# Patient Record
Sex: Female | Born: 1975 | Race: White | Hispanic: No | Marital: Single | Smoking: Current every day smoker
Health system: Southern US, Community
[De-identification: ages and names within clinical notes are randomized; demographics above are authoritative.]

## PROBLEM LIST (undated history)

## (undated) DIAGNOSIS — F4310 Post-traumatic stress disorder, unspecified: Secondary | ICD-10-CM

## (undated) DIAGNOSIS — F419 Anxiety disorder, unspecified: Secondary | ICD-10-CM

## (undated) DIAGNOSIS — R569 Unspecified convulsions: Secondary | ICD-10-CM

## (undated) DIAGNOSIS — Z22322 Carrier or suspected carrier of Methicillin resistant Staphylococcus aureus: Secondary | ICD-10-CM

## (undated) HISTORY — PX: OTHER SURGICAL HISTORY: SHX169

## (undated) HISTORY — PX: APPENDECTOMY: SHX54

---

## 2014-08-19 ENCOUNTER — Emergency Department (HOSPITAL_COMMUNITY): Payer: Self-pay

## 2014-08-19 ENCOUNTER — Encounter (HOSPITAL_COMMUNITY): Payer: Self-pay | Admitting: Emergency Medicine

## 2014-08-19 ENCOUNTER — Emergency Department (HOSPITAL_COMMUNITY)
Admission: EM | Admit: 2014-08-19 | Discharge: 2014-08-20 | Disposition: A | Discharge status: Home / Self Care | Payer: Self-pay | Attending: Emergency Medicine | Admitting: Emergency Medicine

## 2014-08-19 DIAGNOSIS — S299XXA Unspecified injury of thorax, initial encounter: Secondary | ICD-10-CM | POA: Insufficient documentation

## 2014-08-19 DIAGNOSIS — Y92410 Unspecified street and highway as the place of occurrence of the external cause: Secondary | ICD-10-CM | POA: Insufficient documentation

## 2014-08-19 DIAGNOSIS — Y999 Unspecified external cause status: Secondary | ICD-10-CM | POA: Insufficient documentation

## 2014-08-19 DIAGNOSIS — Z8659 Personal history of other mental and behavioral disorders: Secondary | ICD-10-CM | POA: Insufficient documentation

## 2014-08-19 DIAGNOSIS — Z720 Tobacco use: Secondary | ICD-10-CM | POA: Insufficient documentation

## 2014-08-19 DIAGNOSIS — S80812A Abrasion, left lower leg, initial encounter: Secondary | ICD-10-CM | POA: Insufficient documentation

## 2014-08-19 DIAGNOSIS — S50811A Abrasion of right forearm, initial encounter: Secondary | ICD-10-CM | POA: Insufficient documentation

## 2014-08-19 DIAGNOSIS — F1910 Other psychoactive substance abuse, uncomplicated: Secondary | ICD-10-CM

## 2014-08-19 DIAGNOSIS — S3992XA Unspecified injury of lower back, initial encounter: Secondary | ICD-10-CM | POA: Insufficient documentation

## 2014-08-19 DIAGNOSIS — S301XXA Contusion of abdominal wall, initial encounter: Secondary | ICD-10-CM | POA: Insufficient documentation

## 2014-08-19 DIAGNOSIS — T148XXA Other injury of unspecified body region, initial encounter: Secondary | ICD-10-CM

## 2014-08-19 DIAGNOSIS — S5011XA Contusion of right forearm, initial encounter: Secondary | ICD-10-CM | POA: Insufficient documentation

## 2014-08-19 DIAGNOSIS — Y9389 Activity, other specified: Secondary | ICD-10-CM | POA: Insufficient documentation

## 2014-08-19 DIAGNOSIS — S8012XA Contusion of left lower leg, initial encounter: Secondary | ICD-10-CM | POA: Insufficient documentation

## 2014-08-19 HISTORY — DX: Unspecified convulsions: R56.9

## 2014-08-19 HISTORY — DX: Anxiety disorder, unspecified: F41.9

## 2014-08-19 HISTORY — DX: Post-traumatic stress disorder, unspecified: F43.10

## 2014-08-19 LAB — PROTIME-INR
INR: 1.11 (ref 0.00–1.49)
Prothrombin Time: 14.5 seconds (ref 11.6–15.2)

## 2014-08-19 LAB — COMPREHENSIVE METABOLIC PANEL
ALK PHOS: 95 U/L (ref 38–126)
ALT: 26 U/L (ref 14–54)
ANION GAP: 12 (ref 5–15)
AST: 49 U/L — ABNORMAL HIGH (ref 15–41)
Albumin: 3.9 g/dL (ref 3.5–5.0)
BUN: 6 mg/dL (ref 6–20)
CO2: 23 mmol/L (ref 22–32)
Calcium: 9.6 mg/dL (ref 8.9–10.3)
Chloride: 103 mmol/L (ref 101–111)
Creatinine, Ser: 0.68 mg/dL (ref 0.44–1.00)
GLUCOSE: 107 mg/dL — AB (ref 65–99)
Potassium: 3.7 mmol/L (ref 3.5–5.1)
SODIUM: 138 mmol/L (ref 135–145)
Total Bilirubin: 1.2 mg/dL (ref 0.3–1.2)
Total Protein: 7.1 g/dL (ref 6.5–8.1)

## 2014-08-19 LAB — CBC
HEMATOCRIT: 31.6 % — AB (ref 36.0–46.0)
HEMOGLOBIN: 10.9 g/dL — AB (ref 12.0–15.0)
MCH: 30.1 pg (ref 26.0–34.0)
MCHC: 34.5 g/dL (ref 30.0–36.0)
MCV: 87.3 fL (ref 78.0–100.0)
PLATELETS: 328 10*3/uL (ref 150–400)
RBC: 3.62 MIL/uL — ABNORMAL LOW (ref 3.87–5.11)
RDW: 12.8 % (ref 11.5–15.5)
WBC: 14.1 10*3/uL — ABNORMAL HIGH (ref 4.0–10.5)

## 2014-08-19 LAB — SAMPLE TO BLOOD BANK

## 2014-08-19 LAB — I-STAT CHEM 8, ED
BUN: 5 mg/dL — AB (ref 6–20)
CALCIUM ION: 1.15 mmol/L (ref 1.12–1.23)
CREATININE: 0.7 mg/dL (ref 0.44–1.00)
Chloride: 102 mmol/L (ref 101–111)
Glucose, Bld: 109 mg/dL — ABNORMAL HIGH (ref 65–99)
HCT: 35 % — ABNORMAL LOW (ref 36.0–46.0)
Hemoglobin: 11.9 g/dL — ABNORMAL LOW (ref 12.0–15.0)
Potassium: 3.9 mmol/L (ref 3.5–5.1)
Sodium: 138 mmol/L (ref 135–145)
TCO2: 20 mmol/L (ref 0–100)

## 2014-08-19 LAB — ETHANOL: Alcohol, Ethyl (B): <5 mg/dL (ref <5)

## 2014-08-19 MED ORDER — SODIUM CHLORIDE 0.9 % IV BOLUS (SEPSIS)
125.0000 mL | Freq: Once | INTRAVENOUS | Status: AC
Start: 2014-08-19 — End: 2014-08-19
  Administered 2014-08-19: 125 mL via INTRAVENOUS

## 2014-08-19 MED ORDER — LORAZEPAM 2 MG/ML IJ SOLN
1.0000 mg | Freq: Once | INTRAMUSCULAR | Status: AC
  Administered 2014-08-19: 1 mg via INTRAVENOUS
  Filled 2014-08-19: qty 1

## 2014-08-19 MED ORDER — IOHEXOL 300 MG/ML  SOLN
100.0000 mL | Freq: Once | INTRAMUSCULAR | Status: AC | PRN
  Administered 2014-08-19: 100 mL via INTRAVENOUS

## 2014-08-19 NOTE — ED Notes (Signed)
Pt moving in bed, will not remain still even when told to by multiple people. Pt trying to get out of bed, will not maintain c-collar. EDP aware. 2mg  total ativan administered at this point.

## 2014-08-19 NOTE — ED Notes (Signed)
Pt reporting that she believes that she had a seizures.
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
## 2014-08-20 LAB — RAPID URINE DRUG SCREEN, HOSP PERFORMED
Amphetamines: POSITIVE — AB
Barbiturates: NOT DETECTED
Benzodiazepines: POSITIVE — AB
COCAINE: POSITIVE — AB
Opiates: NOT DETECTED
Tetrahydrocannabinol: POSITIVE — AB

## 2014-08-20 LAB — URINALYSIS, ROUTINE W REFLEX MICROSCOPIC
Bilirubin Urine: NEGATIVE
Glucose, UA: NEGATIVE mg/dL
HGB URINE DIPSTICK: NEGATIVE
Ketones, ur: 15 mg/dL — AB
LEUKOCYTES UA: NEGATIVE
NITRITE: NEGATIVE
PROTEIN: NEGATIVE mg/dL
SPECIFIC GRAVITY, URINE: 1.042 — AB (ref 1.005–1.030)
UROBILINOGEN UA: 1 mg/dL (ref 0.0–1.0)
pH: 6 (ref 5.0–8.0)

## 2014-08-20 LAB — CDS SEROLOGY

## 2014-08-20 MED ORDER — IBUPROFEN 800 MG PO TABS: 800.0000 mg | ORAL_TABLET | Freq: Three times a day (TID) | ORAL | Status: AC

## 2014-08-20 NOTE — ED Notes (Signed)
Transporting patient to new room assignment. 

## 2014-08-20 NOTE — ED Notes (Signed)
Pt is awake and oriented. Pt is unable to find a ride home at this time.
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
## 2015-02-09 ENCOUNTER — Emergency Department: Payer: Self-pay

## 2015-02-09 ENCOUNTER — Emergency Department
Admission: EM | Admit: 2015-02-09 | Discharge: 2015-02-09 | Disposition: A | Discharge status: Home / Self Care | Payer: Self-pay | Attending: Emergency Medicine | Admitting: Emergency Medicine

## 2015-02-09 ENCOUNTER — Encounter: Payer: Self-pay | Admitting: Emergency Medicine

## 2015-02-09 DIAGNOSIS — F419 Anxiety disorder, unspecified: Secondary | ICD-10-CM | POA: Insufficient documentation

## 2015-02-09 DIAGNOSIS — S300XXA Contusion of lower back and pelvis, initial encounter: Secondary | ICD-10-CM | POA: Insufficient documentation

## 2015-02-09 DIAGNOSIS — S30810A Abrasion of lower back and pelvis, initial encounter: Secondary | ICD-10-CM | POA: Insufficient documentation

## 2015-02-09 DIAGNOSIS — Y9389 Activity, other specified: Secondary | ICD-10-CM | POA: Insufficient documentation

## 2015-02-09 DIAGNOSIS — F4310 Post-traumatic stress disorder, unspecified: Secondary | ICD-10-CM | POA: Insufficient documentation

## 2015-02-09 DIAGNOSIS — Y998 Other external cause status: Secondary | ICD-10-CM | POA: Insufficient documentation

## 2015-02-09 DIAGNOSIS — W108XXA Fall (on) (from) other stairs and steps, initial encounter: Secondary | ICD-10-CM | POA: Insufficient documentation

## 2015-02-09 DIAGNOSIS — Y9289 Other specified places as the place of occurrence of the external cause: Secondary | ICD-10-CM | POA: Insufficient documentation

## 2015-02-09 DIAGNOSIS — F17200 Nicotine dependence, unspecified, uncomplicated: Secondary | ICD-10-CM | POA: Insufficient documentation

## 2015-02-09 MED ORDER — IBUPROFEN 800 MG PO TABS: 800.0000 mg | ORAL_TABLET | Freq: Three times a day (TID) | ORAL | Status: AC | PRN

## 2015-02-09 MED ORDER — OXYCODONE-ACETAMINOPHEN 7.5-325 MG PO TABS: 1.0000 | ORAL_TABLET | Freq: Four times a day (QID) | ORAL | Status: DC | PRN

## 2015-02-09 MED ORDER — KETOROLAC TROMETHAMINE 60 MG/2ML IM SOLN
60.0000 mg | Freq: Once | INTRAMUSCULAR | Status: AC
  Administered 2015-02-09: 60 mg via INTRAMUSCULAR
  Filled 2015-02-09: qty 2

## 2015-02-09 MED ORDER — TRAMADOL HCL 50 MG PO TABS: 50.0000 mg | ORAL_TABLET | Freq: Four times a day (QID) | ORAL | Status: AC | PRN

## 2015-02-09 MED ORDER — HYDROMORPHONE HCL 1 MG/ML IJ SOLN
1.0000 mg | Freq: Once | INTRAMUSCULAR | Status: AC
  Administered 2015-02-09: 1 mg via INTRAMUSCULAR
  Filled 2015-02-09: qty 1

## 2015-02-09 NOTE — ED Provider Notes (Deleted)
[facility] Emergency Department Provider Note  ____________________________________________  Time seen: Approximately 1:28 PM  I have reviewed the triage vital signs and the nursing notes.   HISTORY  Chief Complaint Fall    HPI Starlyn Droge is a 39 y.o. female patient slipped and fell yesterday morning landing on concrete steps. Patient complaining of coccyx pain secondary to the incident. Patient stated the pain has increased since yesterday. Patient taken over-the-counter ibuprofen which has not relieved her pain. Patient is concerned because she has had a fusion of the lower lumbar spine 4 years ago. Patient rating the pain as 8/10. Patient denies any bladder or bowel dysfunction.   Past Medical History  Diagnosis Date  . Seizures (HCC)   . PTSD (post-traumatic stress disorder)   . Anxiety     There are no active problems to display for this patient.   Past Surgical History  Procedure Laterality Date  . Appendectomy      Current Outpatient Rx  Name  Route  Sig  Dispense  Refill  . ibuprofen (ADVIL,MOTRIN) 800 MG tablet   Oral   Take 1 tablet (800 mg total) by mouth 3 (three) times daily.   21 tablet   0   . ibuprofen (ADVIL,MOTRIN) 800 MG tablet   Oral   Take 1 tablet (800 mg total) by mouth every 8 (eight) hours as needed for moderate pain.   15 tablet   0   . oxyCODONE-acetaminophen (PERCOCET) 7.5-325 MG tablet   Oral   Take 1 tablet by mouth every 6 (six) hours as needed for severe pain.   12 tablet   0     Allergies Review of patient's allergies indicates no known allergies.  History reviewed. No pertinent family history.  Social History Social History  Substance Use Topics  . Smoking status: Current Every Day Smoker  . Smokeless tobacco: None  . Alcohol Use: Yes    Review of Systems Constitutional: No fever/chills Eyes: No visual changes. ENT: No sore throat. Cardiovascular: Denies chest pain. Respiratory:  Denies shortness of breath. Gastrointestinal: No abdominal pain.  No nausea, no vomiting.  No diarrhea.  No constipation. Genitourinary: Negative for dysuria. Musculoskeletal: Coccyx pain Skin: Negative for rash. Abrasion to the right buttocks Neurological: Negative for headaches, focal weakness or numbness. Psychiatric:Anxiety and posttraumatic stress syndrome 10-point ROS otherwise negative.  ____________________________________________   PHYSICAL EXAM:  VITAL SIGNS: ED Triage Vitals  Enc Vitals Group     BP 02/09/15 1303 129/81 mmHg     Pulse Rate 02/09/15 1303 92     Resp 02/09/15 1303 16     Temp 02/09/15 1303 97.5 F (36.4 C)     Temp Source 02/09/15 1303 Oral     SpO2 02/09/15 1303 99 %     Weight 02/09/15 1303 149 lb (67.586 kg)     Height 02/09/15 1303  (1.6 m)     Head Cir --      Peak Flow --      Pain Score 02/09/15 1259 8     Pain Loc --      Pain Edu? --      Excl. in GC? --     Constitutional: Alert and oriented. Moderate distress. Eyes: Conjunctivae are normal. PERRL. EOMI. Head: Atraumatic. Nose: No congestion/rhinnorhea. Mouth/Throat: Mucous membranes are moist.  Oropharynx non-erythematous. Neck: No stridor.  No cervical spine tenderness to palpation. Hematological/Lymphatic/Immunilogical: No cervical lymphadenopathy. Cardiovascular: Normal rate, regular rhythm. Grossly normal heart sounds.  Good  peripheral circulation. Respiratory: Normal respiratory effort.  No retractions. Lungs CTAB. Gastrointestinal: Soft and nontender. No distention. No abdominal bruits. No CVA tenderness. Musculoskeletal: No obvious spinal deformity. Surgical scar consistent with past history. Tender palpation L4-S1. Patient decreased range of motion with flexion.  Neurologic:  Normal speech and language. No gross focal neurologic deficits are appreciated. No gait instability. Skin:  Skin is warm, dry and intact. No rash noted. Abrasions and ecchymosis to the right  buttocks. Psychiatric: Mood and affect are normal. Speech and behavior are normal.  ____________________________________________   LABS (all labs ordered are listed, but only abnormal results are displayed)  Labs Reviewed - No data to display ____________________________________________  EKG   ____________________________________________  RADIOLOGY  No acute findings on x-ray. ____________________________________________   PROCEDURES  Procedure(s) performed: None  Critical Care performed: No  ____________________________________________   INITIAL IMPRESSION / ASSESSMENT AND PLAN / ED COURSE  Pertinent labs & imaging results that were available during my care of the patient were reviewed by me and considered in my medical decision making (see chart for details).  Coccyx contusion. Discussed negative findings on x-ray of patient. Patient given home care instructions. Patient given a prescription for Percocets and ibuprofen. Patient [facility]. ____________________________________________   FINAL CLINICAL IMPRESSION(S) / ED DIAGNOSES  Final diagnoses:  Coccyx contusion, initial encounter      Joni ReiningRonald K Jamesen Stahnke, PA-C 02/09/15 1429

## 2015-02-09 NOTE — ED Notes (Signed)
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

## 2015-02-09 NOTE — ED Notes (Signed)
Patient had fall yesterday morning at 10am.  Patient reports slipping down 1 step and landing on buttocks.

## 2015-02-14 ENCOUNTER — Encounter: Payer: Self-pay | Admitting: Emergency Medicine

## 2015-02-14 ENCOUNTER — Emergency Department
Admission: EM | Admit: 2015-02-14 | Discharge: 2015-02-14 | Disposition: A | Discharge status: Home / Self Care | Payer: Self-pay | Attending: Emergency Medicine | Admitting: Emergency Medicine

## 2015-02-14 DIAGNOSIS — F17210 Nicotine dependence, cigarettes, uncomplicated: Secondary | ICD-10-CM | POA: Insufficient documentation

## 2015-02-14 DIAGNOSIS — A4902 Methicillin resistant Staphylococcus aureus infection, unspecified site: Secondary | ICD-10-CM | POA: Insufficient documentation

## 2015-02-14 DIAGNOSIS — Z791 Long term (current) use of non-steroidal anti-inflammatories (NSAID): Secondary | ICD-10-CM | POA: Insufficient documentation

## 2015-02-14 HISTORY — DX: Carrier or suspected carrier of methicillin resistant Staphylococcus aureus: Z22.322

## 2015-02-14 MED ORDER — HYDROCODONE-ACETAMINOPHEN 5-325 MG PO TABS: 1.0000 | ORAL_TABLET | ORAL | Status: AC | PRN

## 2015-02-14 MED ORDER — SULFAMETHOXAZOLE-TRIMETHOPRIM 800-160 MG PO TABS: 1.0000 | ORAL_TABLET | Freq: Two times a day (BID) | ORAL | Status: AC

## 2015-02-14 NOTE — ED Notes (Signed)
States blistered and then scabbed over

## 2015-02-14 NOTE — Discharge Instructions (Signed)
Antibiotic Medicine °Antibiotic medicines are used to treat infections caused by bacteria. They work by injuring or killing the bacteria that is making you sick. °HOW IS AN ANTIBIOTIC CHOSEN? °An antibiotic is chosen based on many factors. To help your health care provider choose one for you, tell your health care provider if: °· You have any allergies. °· You are pregnant or plan to get pregnant. °· You are breastfeeding. °· You are taking any medicines. These include over-the-counter medicines, prescription medicines, and herbal remedies. °· You have a medical condition or problem you have not already discussed. °Your health care provider will also consider: °· How often the medicine has to be taken. °· Common side effects of the medicine. °· The cost of the medicine. °· The taste of the medicine. °If you have questions about why an antibiotic was chosen, make sure to ask. °FOR HOW LONG SHOULD I TAKE MY ANTIBIOTIC? °Continue to take your antibiotic for as long as told by your health care provider. Do not stop taking it when you feel better. If you stop taking it too soon: °· You may start to feel sick again. °· Your infection may become harder to treat. °· Complications may develop. °WHAT IF I MISS A DOSE? °Try not to miss any doses of medicine. If you miss a dose, take it as soon as possible. However, if it is almost time for the next dose: °· If you are taking 2 doses per day, take the missed dose and the next dose 5 to 6 hours apart. °· If you are taking 3 or more doses per day, take the missed dose and the next dose 2 to 4 hours apart, then go back to the normal schedule. °If you cannot make up a missed dose, take the next scheduled dose on time. Then take the missed dose after you have taken all the doses as recommended by your health care provider, as if you had one more dose left. °DO ANTIBIOTICS AFFECT BIRTH CONTROL? °Birth control pills may not work while you are on antibiotics. If you are taking birth  control pills, continue taking them as usual and use a second form of birth control, such as a condom, to avoid unwanted pregnancy. Continue using the second form of birth control until you are finished with your current 1 month cycle of birth control pills. °OTHER INFORMATION °· If there is any medicine left over, throw it away. °· Never take someone else's antibiotics. °· Never take leftover antibiotics. °SEEK MEDICAL CARE IF: °· You get worse. °· You do not feel better within a few days of starting the antibiotic medicine. °· You vomit. °· White patches appear in your mouth. °· You have new joint pain that begins after starting the antibiotic. °· You have new muscle aches that begin after starting the antibiotic. °· You had a fever before starting the antibiotic and it returns. °· You have any symptoms of an allergic reaction, such as an itchy rash. If this happens, stop taking the antibiotic. °SEEK IMMEDIATE MEDICAL CARE IF: °· Your urine turns dark or becomes blood-colored. °· Your skin turns yellow. °· You bruise or bleed easily. °· You have severe diarrhea and abdominal cramps. °· You have a severe headache. °· You have signs of a severe allergic reaction, such as: °¨ Trouble breathing. °¨ Wheezing. °¨ Swelling of the lips, tongue, or face. °¨ Fainting. °¨ Blisters on the skin or in the mouth. °If you have signs of a severe allergic   reaction, stop taking the antibiotic right away.   This information is not intended to replace advice given to you by your health care provider. Make sure you discuss any questions you have with your health care provider.   Document Released: 11/30/2003 Document Revised: 12/08/2014 Document Reviewed: 08/04/2014 Elsevier Interactive Patient Education 2016 Elsevier Inc.  Community-Associated MRSA CA-MRSA stands for community-associated methicillin-resistant Staphylococcus aureus. MRSA is a type of bacteria that is resistant to some common antibiotic medicines. It can cause  infections in the skin and many other places in the body. Staphylococcus aureus, which is often called staph, is a bacterium that normally lives on the skin or in the nose of some people. Staph on the surface of the skin or in the nose does not cause problems. However, if the staph enters the body through a cut, wound, or break in the skin, an infection can happen. Until recently, infections with the MRSA type of staph occurred mainly in hospitals and other health care settings. Now there are increasing problems with MRSA infections in the community as well. Infections with MRSA may be very serious or even life-threatening. CAUSES  All staph bacteria, including MRSA:  Are normally harmless unless they enter the body through a scratch, cut, or wound, such as with surgery.  Can be spread from person to person by touching contaminated objects or through direct contact.  Cases of MRSA diseases in the community have been associated with:  Recent antibiotic use.  Sharing contaminated towels or clothes.  Having active skin diseases.  Participating in contact sports.  Living in crowded settings.  IV drug use.  Community-associated MRSA infections are usually skin infections, but they may cause other severe illnesses, such as:  Pneumonia.  Bone or joint infections.  Bloodstream infections (sepsis). SYMPTOMS This condition usually starts with a skin infection. Signs and symptoms may vary and can include:  An infected area of skin that is red and swollen, feels painful, and is warm to the touch.  Pus under the skin or pus draining from the infected area.  Fever. DIAGNOSIS This condition is diagnosed by cultures of fluid samples that may come from:  Swabs that are taken from cuts or wounds in infected areas.  Nasal swabs.  Saliva or deep-cough specimens from the lungs (sputum).  Urine.  Blood. TREATMENT  Treatment varies and is based on how serious, how deep, or how extensive  the infection is. For example:  Some skin infections, such as a small boil or abscess, may be treated by draining pus from the site of the infection.  Deeper or more widespread soft tissue infections are usually treated with surgery to drain pus and with antibiotics that are given through a vein or by mouth. This may be recommended even if you are pregnant.  Serious infections may require a hospital stay. If antibiotics are prescribed, they may be needed for several weeks. HOME CARE INSTRUCTIONS  Take your antibiotics as directed by your health care provider. Finish the antibiotic even if you start to feel better.  Avoid close contact with people around you as much as possible. Do not use towels, razors, toothbrushes, bedding, or other items that will be used by others.  To fight the infection, follow your health care provider's instructions for wound care. Wash your hands before and after changing your bandages (dressings).  If you have an intravascular device, such as a catheter or port, make sure that you know how to care for it. This might include:  Keeping the skin around the area clean. °¨ Avoiding having your blood pressure taken on the side where the catheter is located. °¨ Knowing when to report any suspected problems to your health care provider. °· Be sure to tell any health care providers who care for you that you have MRSA so they are aware of your infection. °PREVENTION °· Wash your hands frequently with soap and water for at least 15 seconds. If soap and water are not available, use alcohol-based hand disinfectants. °· Make sure that people who live with you wash their hands often, too. °· Do not share personal items. For example, avoid sharing razors and other personal hygiene items, towels, clothing, and athletic equipment. °· Wash and dry your clothes and bedding at the warmest temperatures that are recommended on the labels. °· Keep wounds covered. Pus from infected sores may  contain MRSA and other bacteria. Keep cuts and abrasions clean and covered with germ-free (sterile), dry bandages until they are healed. °· If you have a wound that appears infected, ask your health care provider if a culture should be done for MRSA and other bacteria. °· If you are breastfeeding, talk to your health care provider about MRSA. You may be asked to temporarily stop breastfeeding. °SEEK IMMEDIATE MEDICAL CARE IF: °· The infection appears to be getting worse. Signs include: °¨ Increased warmth, redness, or tenderness around the wound site. °¨ A red line that extends from the infection site. °¨ A dark color in the area around the infection. °¨ Wound drainage that is tan, yellow, or green. °¨ A bad smell coming from the wound. °· You feel nauseous, you vomit, or you cannot keep medicine down. °· You have a fever. °· You have difficulty breathing. °  °This information is not intended to replace advice given to you by your health care provider. Make sure you discuss any questions you have with your health care provider. °  °Document Released: 06/22/2005 Document Revised: 04/09/2014 Document Reviewed: 06/22/2010 °Elsevier Interactive Patient Education ©2016 Elsevier Inc. ° °

## 2015-02-14 NOTE — ED Provider Notes (Signed)
[facility] Emergency Department Provider Note  ____________________________________________  Time seen: Approximately 1:11 PM  I have reviewed the triage vital signs and the nursing notes.   HISTORY  Chief Complaint Wound Infection    HPI Patricia Walsh is a 39 y.o. female who presents to the emergency department complaining of skin infection to right upper buttocks.. She states that she has a history of MRSA and that didn't are the same as previous. She states that area first "itched", subsequently 1 "blisters formed", and then area started to use a pus. She states thatshe has had a recent outbreak on her arms that were improving prior to this. She denies fevers or chills. She denies nausea or vomiting. She denies any other outbreak at this time.   Past Medical History  Diagnosis Date  . Seizures (HCC)   . PTSD (post-traumatic stress disorder)   . Anxiety   . MRSA (methicillin resistant staph aureus) culture positive     There are no active problems to display for this patient.   Past Surgical History  Procedure Laterality Date  . Appendectomy    . Spinal fusions      Current Outpatient Rx  Name  Route  Sig  Dispense  Refill  . HYDROcodone-acetaminophen (NORCO/VICODIN) 5-325 MG tablet   Oral   Take 1 tablet by mouth every 4 (four) hours as needed for moderate pain.   15 tablet   0   . ibuprofen (ADVIL,MOTRIN) 800 MG tablet   Oral   Take 1 tablet (800 mg total) by mouth 3 (three) times daily.   21 tablet   0   . ibuprofen (ADVIL,MOTRIN) 800 MG tablet   Oral   Take 1 tablet (800 mg total) by mouth every 8 (eight) hours as needed for moderate pain.   15 tablet   0   . ibuprofen (ADVIL,MOTRIN) 800 MG tablet   Oral   Take 1 tablet (800 mg total) by mouth every 8 (eight) hours as needed for moderate pain.   15 tablet   0   . sulfamethoxazole-trimethoprim (BACTRIM DS,SEPTRA DS) 800-160 MG tablet   Oral   Take 1 tablet by mouth 2 (two)  times daily.   14 tablet   0   . traMADol (ULTRAM) 50 MG tablet   Oral   Take 1 tablet (50 mg total) by mouth every 6 (six) hours as needed for moderate pain.   12 tablet   0     Allergies Review of patient's allergies indicates no known allergies.  No family history on file.  Social History Social History  Substance Use Topics  . Smoking status: Current Every Day Smoker -- 0.50 packs/day    Types: Cigarettes  . Smokeless tobacco: None  . Alcohol Use: No    Review of Systems Constitutional: No fever/chills Eyes: No visual changes. ENT: No sore throat. Cardiovascular: Denies chest pain. Respiratory: Denies shortness of breath. Gastrointestinal: No abdominal pain.  No nausea, no vomiting.  No diarrhea.  No constipation. Genitourinary: Negative for dysuria. Musculoskeletal: Negative for back pain. Skin: Negative for rash. Endorses skin lesion to right upper buttocks. Neurological: Negative for headaches, focal weakness or numbness.  10-point ROS otherwise negative.  ____________________________________________   PHYSICAL EXAM:  VITAL SIGNS: ED Triage Vitals  Enc Vitals Group     BP 02/14/15 1204 115/70 mmHg     Pulse Rate 02/14/15 1204 95     Resp 02/14/15 1204 20     Temp 02/14/15 1204 98.2  F (36.8 C)     Temp Source 02/14/15 1204 Oral     SpO2 02/14/15 1204 100 %     Weight 02/14/15 1204 150 lb (68.04 kg)     Height 02/14/15 1204 5\' 3"  (1.6 m)     Head Cir --      Peak Flow --      Pain Score 02/14/15 1206 8     Pain Loc --      Pain Edu? --      Excl. in GC? --     Constitutional: Alert and oriented. Well appearing and in no acute distress. Eyes: Conjunctivae are normal. PERRL. EOMI. Head: Atraumatic. Nose: No congestion/rhinnorhea. Mouth/Throat: Mucous membranes are moist.  Oropharynx non-erythematous. Neck: No stridor.   Cardiovascular: Normal rate, regular rhythm. Grossly normal heart sounds.  Good peripheral circulation. Respiratory: Normal  respiratory effort.  No retractions. Lungs CTAB. Gastrointestinal: Soft and nontender. No distention. No abdominal bruits. No CVA tenderness. Musculoskeletal: No lower extremity tenderness nor edema.  No joint effusions. Neurologic:  Normal speech and language. No gross focal neurologic deficits are appreciated. No gait instability. Skin:  Skin is warm, dry and intact. No rash noted. Lesion is noted to right upper buttocks. Area has multiple erosions, with surrounding erythema and edema. No drainage at this time. No fluctuance noted. Area is tender to palpation. Psychiatric: Mood and affect are normal. Speech and behavior are normal.  ____________________________________________   LABS (all labs ordered are listed, but only abnormal results are displayed)  Labs Reviewed - No data to display ____________________________________________  EKG   ____________________________________________  RADIOLOGY   ____________________________________________   PROCEDURES  Procedure(s) performed: None  Critical Care performed: No  ____________________________________________   INITIAL IMPRESSION / ASSESSMENT AND PLAN / ED COURSE  Pertinent labs & imaging results that were available during my care of the patient were reviewed by me and considered in my medical decision making (see chart for details).  The patient's history, symptoms, physical exam are taken into consideration for diagnosis. Skin lesion is consistent with MRSA and patient states that she has had previous MRSA infections. I will place the patient on Bactrim for treatment. Patient is to follow-up with primary care if symptoms are not resolved with antibiotic treatment. The patient will be given prescription for pain medication for symptom control. Patient verbalizes understanding of the diagnosis and treatment plan verbalizes compliance with same. ____________________________________________   FINAL CLINICAL IMPRESSION(S) / ED  DIAGNOSES  Final diagnoses:  MRSA (methicillin resistant Staphylococcus aureus)      Racheal PatchesJonathan D Courtney Fenlon, PA-C 02/14/15 1321  Governor Rooksebecca Lord, MD 02/14/15 1540

## 2015-02-24 NOTE — ED Provider Notes (Signed)
-----------------------------------------   3:11 PM on 02/24/2015 -----------------------------------------   Blood pressure 119/69, pulse 77, temperature 97.5 F (36.4 C), temperature source Oral, resp. rate 20, height 5\' 3"  (1.6 m), weight 149 lb (67.586 kg), last menstrual period 02/09/2015, SpO2 99 %.  Reviewed note from physician assistant.  In short, Patricia Walsh is a 39 y.o. female with a chief complaint of Fall .  Refer to the original H&P for additional details.  The current plan of care is to agree with current management. Patient was status post non-syncopal fall with coccyx injury.   Jennye MoccasinBrian S Amylah Will, MD 02/24/15 [phone]

## 2017-05-14 IMAGING — CR DG SACRUM/COCCYX 2+V
1 series · 3 of 3 positions shown · non-contrast
Comparison: None.

CLINICAL DATA: Fall yesterday.  Back pain.

EXAM:
SACRUM AND COCCYX - 2+ VIEW

[Series 1: dg sacrum/coccyx · 0.14mm/px · 3 of 3 slices shown]
[im 1/3]
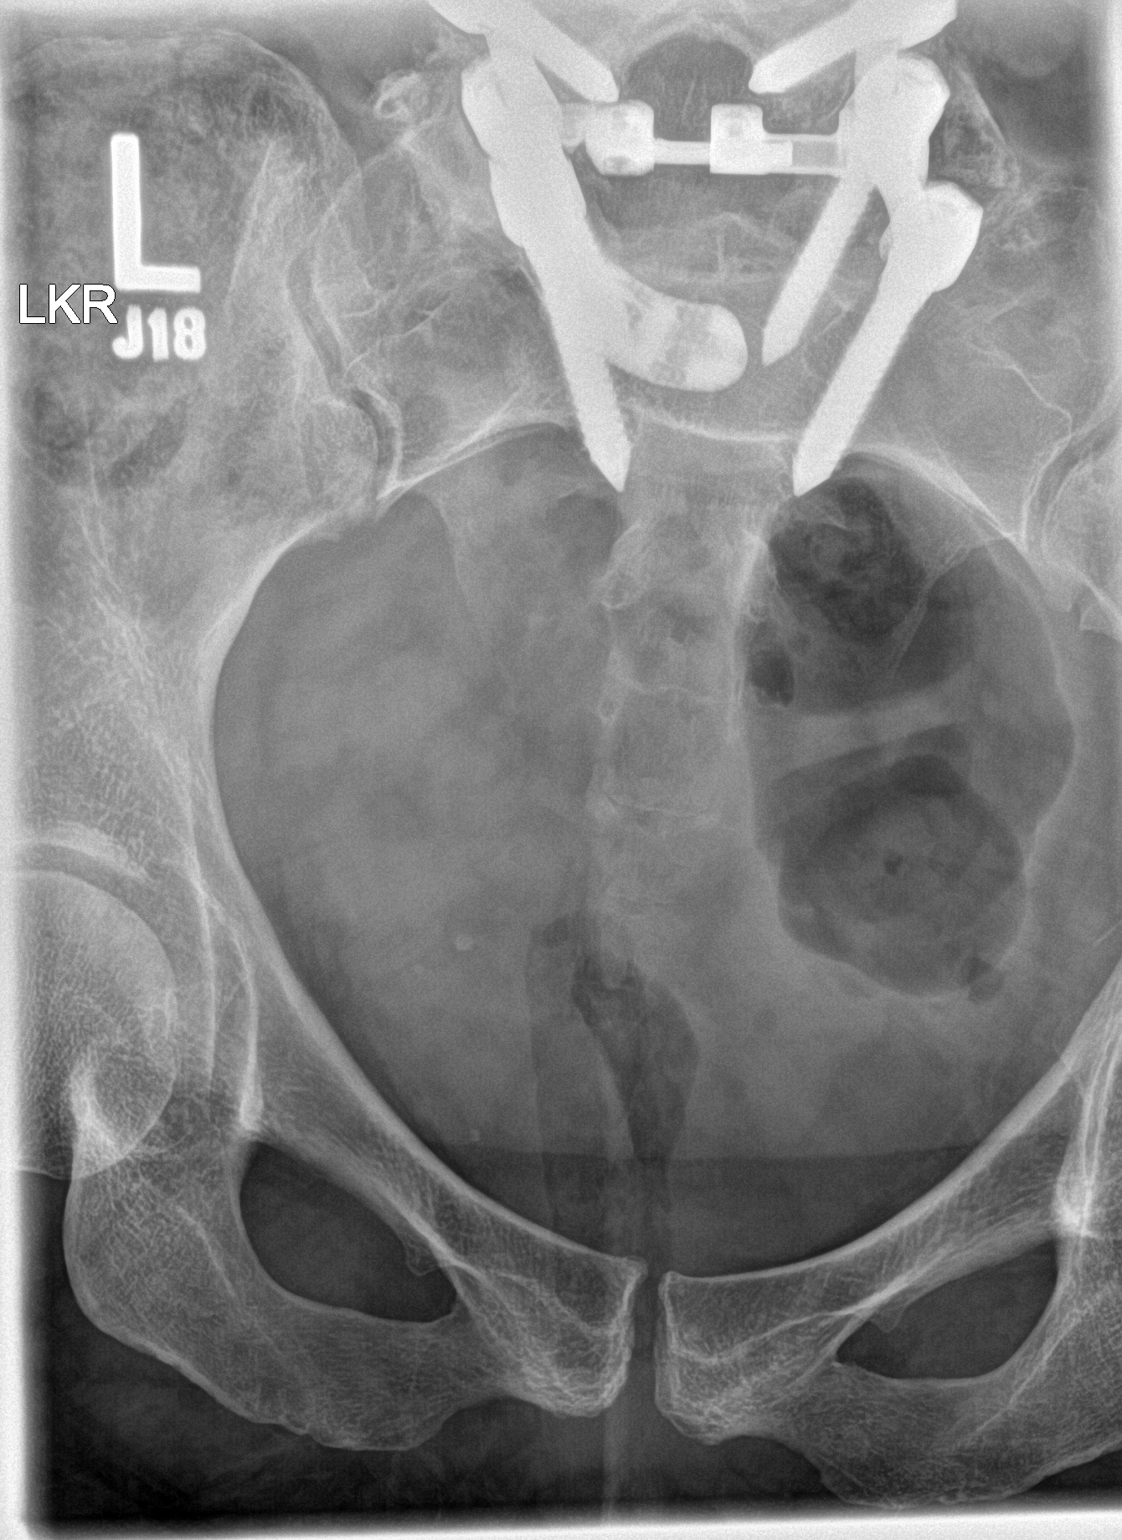
[im 2/3]
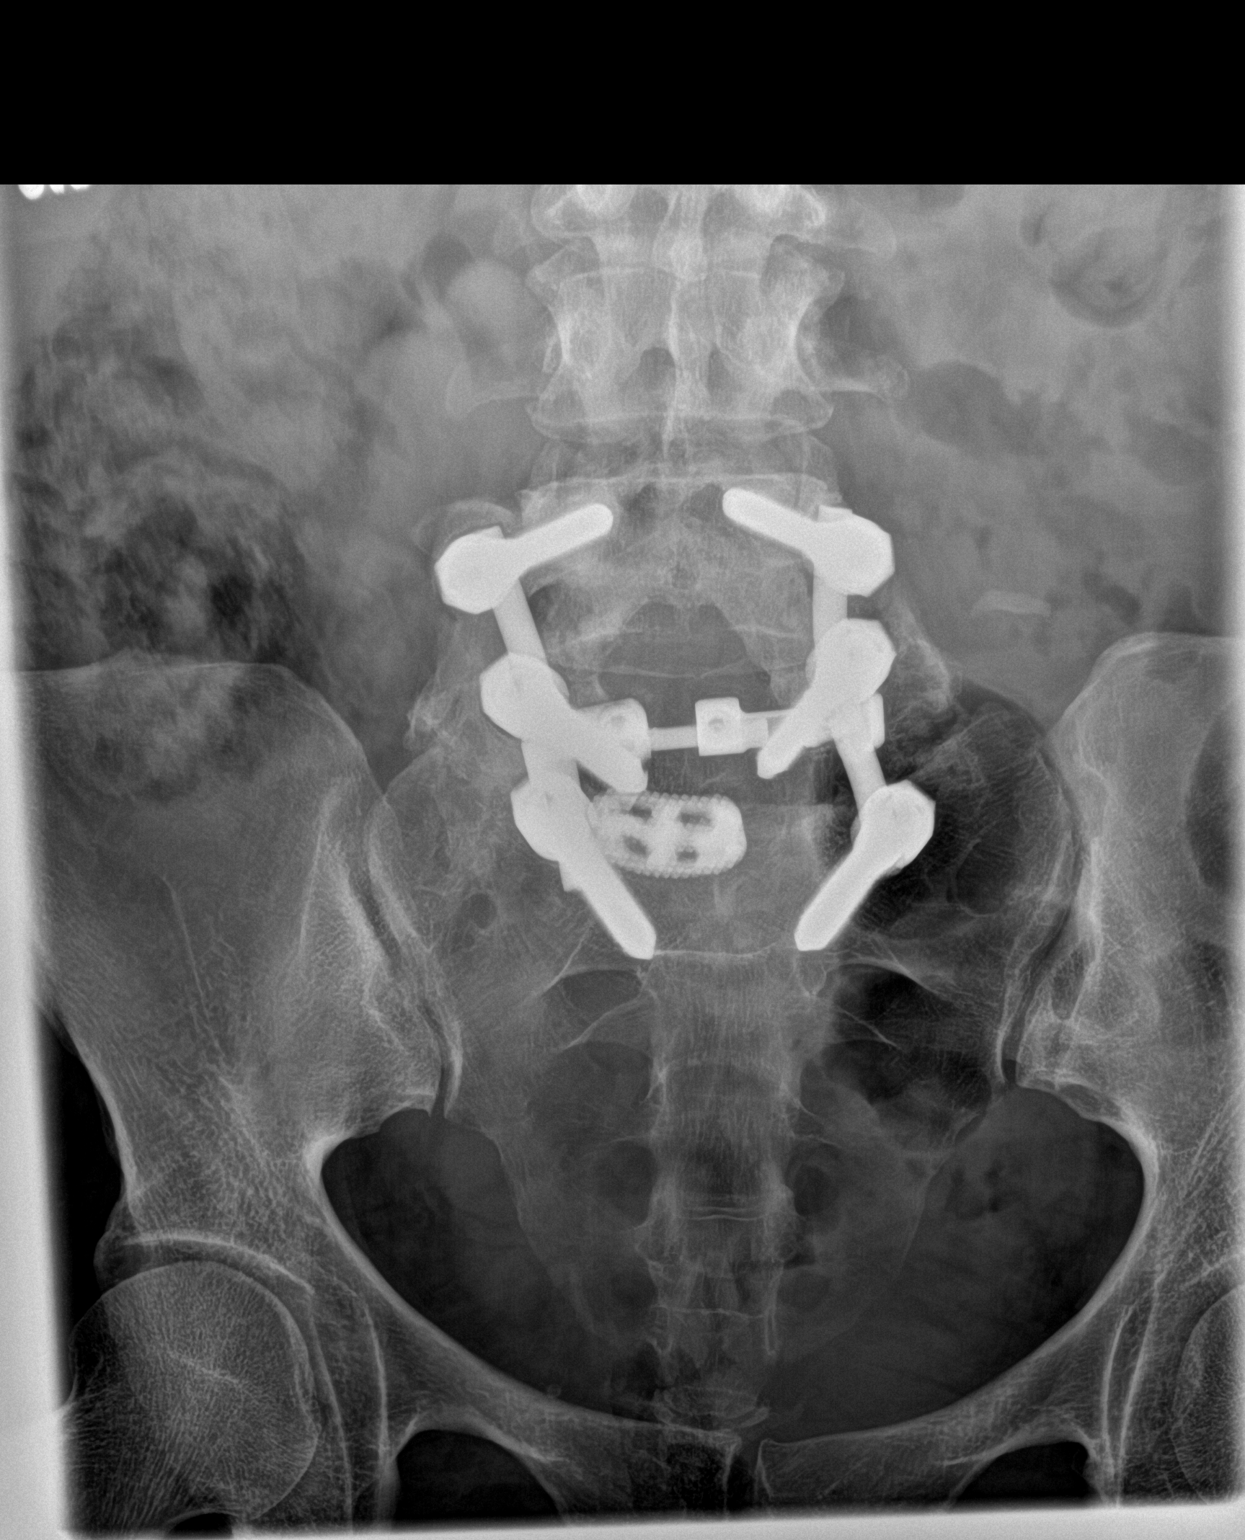
[im 3/3]
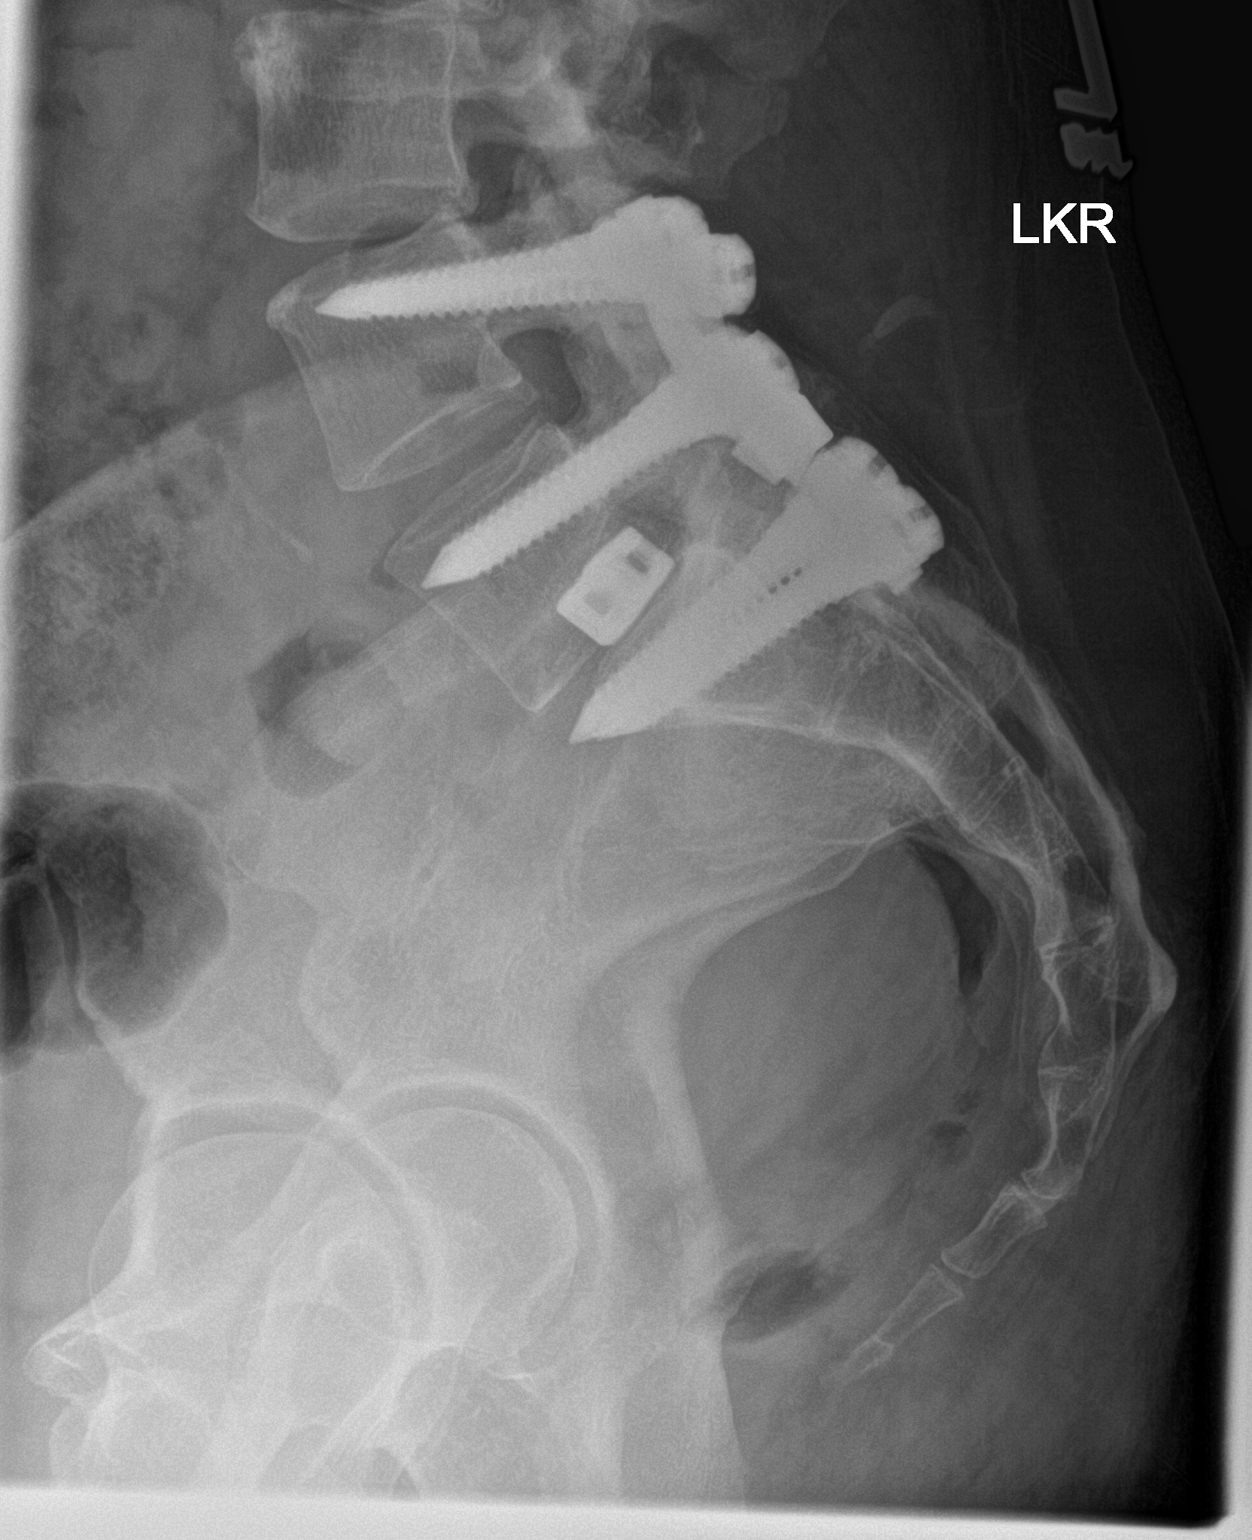

[3 of 3 positions shown; findings below may reference images not displayed]

FINDINGS: Posterior fusion from L4-S1. No evidence of hardware fracture. No
subluxation. No evidence of displaced fracture of the sacrum or
coccyx..
IMPRESSION: No acute fracture or dislocation
